# Patient Record
Sex: Male | Born: 1990 | Race: White | Hispanic: No | Marital: Married | Smoking: Never smoker
Health system: Southern US, Community
[De-identification: ages and names within clinical notes are randomized; demographics above are authoritative.]

## PROBLEM LIST (undated history)

## (undated) DIAGNOSIS — G4730 Sleep apnea, unspecified: Secondary | ICD-10-CM

## (undated) HISTORY — DX: Sleep apnea, unspecified: G47.30

---
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
## 2013-04-16 HISTORY — PX: APPENDECTOMY: SHX54

## 2019-01-08 ENCOUNTER — Encounter: Payer: Self-pay | Admitting: Cardiology

## 2019-01-08 ENCOUNTER — Other Ambulatory Visit: Payer: Self-pay

## 2019-01-08 ENCOUNTER — Ambulatory Visit (INDEPENDENT_AMBULATORY_CARE_PROVIDER_SITE_OTHER): Payer: BC Managed Care – PPO | Admitting: Cardiology

## 2019-01-08 VITALS — BP 124/72 | HR 45 | Ht 76.0 in | Wt 279.0 lb

## 2019-01-08 DIAGNOSIS — Z7189 Other specified counseling: Secondary | ICD-10-CM

## 2019-01-08 DIAGNOSIS — Z8249 Family history of ischemic heart disease and other diseases of the circulatory system: Secondary | ICD-10-CM

## 2019-01-08 DIAGNOSIS — R002 Palpitations: Secondary | ICD-10-CM

## 2019-01-08 NOTE — Progress Notes (Signed)
Cardiology Office Note:    Date:  01/08/2019   ID:  Jose Jenkins, DOB 11/14/1990, MRN [id number]  PCP:  Patient, No Pcp Per  Cardiologist:  Jodelle RedBridgette Timothy Trudell, MD PhD  Referring MD: Clent RidgesMercado, Ray Anthony, DO   Chief Complaint  Patient presents with  . New Patient (Initial Visit)    referred by PCP for Wplf-parkinson. Meds reviewed verbally with patient.     History of Present Illness:    Jose Jenkins is a 28 y.o. male with a hx of sleep apnea who is seen as a new consult at the request of Clent RidgesMercado, Ray Anthony, DO for the evaluation and management of CV risk/family history.  Cardiovascular risk factors: Prior clinical ASCVD: none Comorbid conditions,: No hypertension, hyperlipidemia, diabetes, chronic kidney disease:  Metabolic syndrome/Obesity: BMI 34 Chronic inflammatory conditions: none Tobacco use history: never Family history: dad had WPW, had ablation 3 years ago. Pat Gpa died of MI at age 29 (had 3 MI prior, smoker). No other heart disease. Griselda MinerPat Gma had diabetes. Pat uncle died suddenly at age 29, not clear why (he was obese, had gastric bypass surgery). Pat great-gpa died suddenly at age 29. Mom's side without issues. Both brothers have been screened for WPW and were negative Prior cardiac testing and/or incidental findings on other testing (ie coronary calcium): none that he knows of. 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 Exercise level: Very active. Works out 4-5 times/week at [employer] gym (starting back now that gyms opening). Hikes. No limitations. Current diet: very health. Wife runs marathons and cooks very healthy.   Feels perfectly healthy. 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 Used to have a lot more with stress. Notes that it takes him a long time to get his heart rate up when he exercises.  Denies chest pain, shortness of breath at rest or with normal exertion. No PND, orthopnea, LE edema or unexpected weight gain. No  syncope.  We discussed what WPW is, reviewed anatomy on 3D model.  Past Medical History:  Diagnosis Date  . Sleep apnea     Past Surgical History:  Procedure Laterality Date  . ADENOIDECTOMY  2004  . APPENDECTOMY  2015  . NASAL SEPTUM SURGERY  2008  . TONSILLECTOMY  2004    Current Medications: Current Outpatient Medications on File Prior to Visit  Medication Sig  . azelastine (ASTELIN) 0.1 % nasal spray   . levocetirizine (XYZAL) 5 MG tablet   . montelukast (SINGULAIR) 10 MG tablet    No current facility-administered medications on file prior to visit.      Allergies:   Amoxicillin   Social History   Socioeconomic History  . Marital status: Married    Spouse name: Not on file  . Number of children: Not on file  . Years of education: Not on file  . Highest education level: Not on file  Occupational History  . Not on file  Social Needs  . Financial resource strain: Not on file  . Food insecurity    Worry: Not on file    Inability: Not on file  . Transportation needs    Medical: Not on file    Non-medical: Not on file  Tobacco Use  . Smoking status: Never Smoker  . Smokeless tobacco: Never Used  Substance and Sexual Activity  . Alcohol use: Yes  . Drug use: Never  . Sexual activity: Not on file  Lifestyle  . Physical activity  Days per week: Not on file    Minutes per session: Not on file  . Stress: Not on file  Relationships  . Social Herbalist on phone: Not on file    Gets together: Not on file    Attends religious service: Not on file    Active member of club or organization: Not on file    Attends meetings of clubs or organizations: Not on file    Relationship status: Not on file  Other Topics Concern  . Not on file  Social History Narrative  . Not on file     Family History: The patient's family history includes Heart attack in his paternal grandmother.  ROS:   Please see the history of present illness.  Additional pertinent  ROS: Constitutional: Negative for chills, fever, night sweats, unintentional weight loss  HENT: Negative for ear pain and hearing loss.   Eyes: Negative for loss of vision and eye pain.  Respiratory: Negative for cough, sputum, wheezing.   Cardiovascular: See HPI. Gastrointestinal: Negative for abdominal pain, melena, and hematochezia.  Genitourinary: Negative for dysuria and hematuria.  Musculoskeletal: Negative for falls and myalgias.  Skin: Negative for itching and rash.  Neurological: Negative for focal weakness, focal sensory changes and loss of consciousness.  Endo/Heme/Allergies: Does not bruise/bleed easily.     EKGs/Labs/Other Studies Reviewed:    The following studies were reviewed today: No prior cardiac studies  EKG:  EKG is personally reviewed.  The ekg ordered today demonstrates sinus bradycardia, HR 45. Normal PR interval, no obvious delta wave  Recent Labs: No results found for requested labs within last 8760 hours.  Recent Lipid Panel No results found for: CHOL, TRIG, HDL, CHOLHDL, VLDL, LDLCALC, LDLDIRECT  Physical Exam:    VS:  BP 124/72 (BP Location: Right Arm, Patient Position: Sitting, Cuff Size: Normal)   Pulse (!) 45   Ht 6\' 4"  (1.93 m)   Wt 279 lb (126.6 kg)   BMI 33.96 kg/m     Wt Readings from Last 3 Encounters:  01/08/19 279 lb (126.6 kg)    GEN: Well nourished, well developed in no acute distress HEENT: Normal, moist mucous membranes NECK: No JVD CARDIAC: regular rhythm, normal S1 and S2, no murmurs, rubs, gallops.  VASCULAR: Radial and DP pulses 2+ bilaterally. No carotid bruits RESPIRATORY:  Clear to auscultation without rales, wheezing or rhonchi  ABDOMEN: Soft, non-tender, non-distended MUSCULOSKELETAL:  Ambulates independently SKIN: Warm and dry, no edema NEUROLOGIC:  Alert and oriented x 3. No focal neuro deficits noted. PSYCHIATRIC:  Normal affect    ASSESSMENT:    1. Palpitations   2. Family history of Wolff-Parkinson-White  (WPW) syndrome   3. Family history of heart disease   4. Cardiac risk counseling   5. Counseling on health promotion and disease prevention    PLAN:    Palpitations, history of WPW in his father, unclear etiology of sudden death in paternal uncle: he personally has not been diagnosed, no syncope, no exercise limitations. Does have palpitations -2 week Zio when insurance changes, he will contact us -discussed treadmill exercise test, but if he wears Zio during his normal exercise, this will be similar -discussed that if he has syncope or very rapid heart rates he needs to call me--he actually has a very low heart rate and has trouble raising it with exercise  Cardiac risk counseling and prevention recommendations: -recommend heart healthy/Mediterranean diet, with whole grains, fruits, vegetable, fish, lean meats, nuts, and olive oil.  Limit salt. -recommend moderate walking, 3-5 times/week for 30-50 minutes each session. Aim for at least 150 minutes.week. Goal should be pace of 3 miles/hours, or walking 1.5 miles in 30 minutes -recommend avoidance of tobacco products. Avoid excess alcohol. -ASCVD risk score: The ASCVD Risk score Denman George DC Jr., et al., 2013) failed to calculate for the following reasons:   The 2013 ASCVD risk score is only valid for ages 75 to 78    Plan for follow up: TBD based on results of testing  Medication Adjustments/Labs and Tests Ordered: Current medicines are reviewed at length with the patient today.  Concerns regarding medicines are outlined above.  Orders Placed This Encounter  Procedures  . EKG 12-Lead   No orders of the defined types were placed in this encounter.   Patient Instructions  Medication Instructions:  Your Physician recommend you continue on your current medication as directed.    If you need a refill on your cardiac medications before your next appointment, please call your pharmacy.   Lab work: None  Testing/Procedures: None   Follow-Up: At [facility], you and your health needs are our priority.  As part of our continuing mission to provide you with exceptional heart care, we have created designated Provider Care Teams.  These Care Teams include your primary Cardiologist (physician) and Advanced Practice Providers (APPs -  Physician Assistants and Nurse Practitioners) who all work together to provide you with the care you need, when you need it. You will need a follow up appointment as needed.  Please call our office 2 months in advance to schedule this appointment.  You may see Jodelle Red, MD or one of the following Advanced Practice Providers on your designated Care Team:   Theodore Demark, PA-C . Joni Reining, DNP, ANP  Any Other Special Instructions Will Be Listed Below (If Applicable). -Call office when you are ready to order Zio monitor.     Signed, Jodelle Red, MD PhD 01/08/2019 10:15 PM    [facility]

## 2019-01-08 NOTE — Patient Instructions (Signed)
Medication Instructions:  Your Physician recommend you continue on your current medication as directed.    If you need a refill on your cardiac medications before your next appointment, please call your pharmacy.   Lab work: None  Testing/Procedures: None  Follow-Up: At [facility], you and your health needs are our priority.  As part of our continuing mission to provide you with exceptional heart care, we have created designated Provider Care Teams.  These Care Teams include your primary Cardiologist (physician) and Advanced Practice Providers (APPs -  Physician Assistants and Nurse Practitioners) who all work together to provide you with the care you need, when you need it. You will need a follow up appointment as needed.  Please call our office 2 months in advance to schedule this appointment.  You may see Buford Dresser, MD or one of the following Advanced Practice Providers on your designated Care Team:   Rosaria Ferries, PA-C . Jory Sims, DNP, ANP  Any Other Special Instructions Will Be Listed Below (If Applicable). -Call office when you are ready to order Zio monitor.

## 2019-04-04 ENCOUNTER — Encounter (HOSPITAL_COMMUNITY): Payer: Self-pay

## 2019-04-04 ENCOUNTER — Emergency Department (HOSPITAL_COMMUNITY): Payer: BC Managed Care – PPO

## 2019-04-04 ENCOUNTER — Emergency Department (HOSPITAL_COMMUNITY)
Admission: EM | Admit: 2019-04-04 | Discharge: 2019-04-04 | Disposition: A | Discharge status: Home / Self Care | Payer: BC Managed Care – PPO | Attending: Emergency Medicine | Admitting: Emergency Medicine

## 2019-04-04 ENCOUNTER — Other Ambulatory Visit: Payer: Self-pay

## 2019-04-04 DIAGNOSIS — U071 COVID-19: Secondary | ICD-10-CM | POA: Insufficient documentation

## 2019-04-04 DIAGNOSIS — R042 Hemoptysis: Secondary | ICD-10-CM | POA: Diagnosis present

## 2019-04-04 LAB — COMPREHENSIVE METABOLIC PANEL
ALT: 30 U/L (ref 0–44)
AST: 18 U/L (ref 15–41)
Albumin: 4.6 g/dL (ref 3.5–5.0)
Alkaline Phosphatase: 58 U/L (ref 38–126)
Anion gap: 10 (ref 5–15)
BUN: 11 mg/dL (ref 6–20)
CO2: 25 mmol/L (ref 22–32)
Calcium: 9.3 mg/dL (ref 8.9–10.3)
Chloride: 104 mmol/L (ref 98–111)
Creatinine, Ser: 0.97 mg/dL (ref 0.61–1.24)
GFR calc Af Amer: >60 mL/min (ref >60)
GFR calc non Af Amer: >60 mL/min (ref >60)
Glucose, Bld: 99 mg/dL (ref 70–99)
Potassium: 4.2 mmol/L (ref 3.5–5.1)
Sodium: 139 mmol/L (ref 135–145)
Total Bilirubin: 0.5 mg/dL (ref 0.3–1.2)
Total Protein: 8 g/dL (ref 6.5–8.1)

## 2019-04-04 LAB — CBC WITH DIFFERENTIAL/PLATELET
Abs Immature Granulocytes: 0.03 10*3/uL (ref 0.00–0.07)
Basophils Absolute: 0 10*3/uL (ref 0.0–0.1)
Basophils Relative: 1 %
Eosinophils Absolute: 0 10*3/uL (ref 0.0–0.5)
Eosinophils Relative: 0 %
HCT: 48.4 % (ref 39.0–52.0)
Hemoglobin: 15.6 g/dL (ref 13.0–17.0)
Immature Granulocytes: 1 %
Lymphocytes Relative: 31 %
Lymphs Abs: 1.9 10*3/uL (ref 0.7–4.0)
MCH: 28.1 pg (ref 26.0–34.0)
MCHC: 32.2 g/dL (ref 30.0–36.0)
MCV: 87.1 fL (ref 80.0–100.0)
Monocytes Absolute: 0.5 10*3/uL (ref 0.1–1.0)
Monocytes Relative: 8 %
Neutro Abs: 3.6 10*3/uL (ref 1.7–7.7)
Neutrophils Relative %: 59 %
Platelets: 206 10*3/uL (ref 150–400)
RBC: 5.56 MIL/uL (ref 4.22–5.81)
RDW: 12.6 % (ref 11.5–15.5)
WBC: 6.1 10*3/uL (ref 4.0–10.5)
nRBC: 0 % (ref 0.0–0.2)

## 2019-04-04 LAB — PROCALCITONIN: Procalcitonin: <0.1 ng/mL

## 2019-04-04 LAB — FERRITIN: Ferritin: 135 ng/mL (ref 24–336)

## 2019-04-04 LAB — C-REACTIVE PROTEIN: CRP: 0.8 mg/dL (ref <1.0)

## 2019-04-04 LAB — D-DIMER, QUANTITATIVE: D-Dimer, Quant: 0.27 ug/mL-FEU (ref 0.00–0.50)

## 2019-04-04 LAB — LACTATE DEHYDROGENASE: LDH: 137 U/L (ref 98–192)

## 2019-04-04 LAB — TRIGLYCERIDES: Triglycerides: 70 mg/dL (ref <150)

## 2019-04-04 LAB — FIBRINOGEN: Fibrinogen: 332 mg/dL (ref 210–475)

## 2019-04-04 LAB — LACTIC ACID, PLASMA
Lactic Acid, Venous: 1.1 mmol/L (ref 0.5–1.9)
Lactic Acid, Venous: 0.9 mmol/L (ref 0.5–1.9)

## 2019-04-04 MED ORDER — AZITHROMYCIN 250 MG PO TABS: 500.0000 mg | ORAL_TABLET | Freq: Once | ORAL | Status: DC

## 2019-04-04 NOTE — ED Provider Notes (Signed)
[facility]-EMERGENCY DEPT Provider Note   CSN: [id number] Arrival date & time: 04/04/19  1815     History Chief Complaint  Patient presents with  . COVID+  . Hemoptysis    Jose Jenkins is a 28 y.o. male.  HPI Patient developed coronavirus symptoms 7 days ago.  Symptoms started with cough fever and body aches.  Patient reports that he did take a Z-Pak and a course of prednisone on outpatient basis.  Several days into his illness he was tested for coronavirus and tested Covid positive.  He reports today he started to have cough that was productive of a blood-tinged sputum.  He reports there were a few dark streaks of blood in the mucus.  No clots.  He denies any focal or sharp chest pain.  He reports he has a little bit of dull congestion kind of sensation.  Patient reports he actually feels better today than he has over the past week.  He felt that he was improving but then when the blood-tinged mucus occurred, his doctor recommended he come to the emergency department and be evaluated for remdesivir therapy.    Past Medical History:  Diagnosis Date  . Sleep apnea     Patient Active Problem List   Diagnosis Date Noted  . Palpitations 01/08/2019  . Family history of Wolff-Parkinson-White (WPW) syndrome 01/08/2019    Past Surgical History:  Procedure Laterality Date  . ADENOIDECTOMY  2004  . APPENDECTOMY  2015  . NASAL SEPTUM SURGERY  2008  . TONSILLECTOMY  2004       Family History  Problem Relation Age of Onset  . Heart attack Paternal Grandmother     Social History   Tobacco Use  . Smoking status: Never Smoker  . Smokeless tobacco: Never Used  Substance Use Topics  . Alcohol use: Yes  . Drug use: Never    Home Medications Prior to Admission medications   Medication Sig Start Date End Date Taking? Authorizing Provider  azelastine (ASTELIN) 0.1 % nasal spray  01/07/19   [provider]  levocetirizine (XYZAL) 5 MG tablet   01/07/19   [provider]  montelukast (SINGULAIR) 10 MG tablet  01/07/19   [provider]    Allergies    Amoxicillin  Review of Systems   Review of Systems 10 Systems reviewed and are negative for acute change except as noted in the HPI. Physical Exam Updated Vital Signs BP (!) 151/88   Pulse (!) 59   Temp 98.6 F (37 C)   Resp (!) 22   Ht 6\' 4"  (1.93 m)   Wt 127 kg   SpO2 99%   BMI 34.08 kg/m   Physical Exam Constitutional:      Comments: Alert nontoxic and clinically well in appearance.  HENT:     Head: Normocephalic and atraumatic.     Mouth/Throat:     Mouth: Mucous membranes are moist.     Pharynx: Oropharynx is clear.     Comments: Airway widely patent and clear.  No exudate or erythema.  No posterior nasal drip or bloody drainage. Eyes:     Extraocular Movements: Extraocular movements intact.  Cardiovascular:     Rate and Rhythm: Normal rate and regular rhythm.     Heart sounds: Normal heart sounds.  Pulmonary:     Comments: No respiratory distress.  Occasional cough.  Lung sounds grossly clear.  Occasional rhonchi clears with cough.  No gross wheezing or rale. Abdominal:  General: There is no distension.     Palpations: Abdomen is soft.     Tenderness: There is no abdominal tenderness. There is no guarding.  Musculoskeletal:        General: No swelling or tenderness. Normal range of motion.     Cervical back: Neck supple.  Skin:    General: Skin is warm and dry.  Neurological:     General: No focal deficit present.     Mental Status: He is oriented to person, place, and time.     Cranial Nerves: No cranial nerve deficit.     Coordination: Coordination normal.  Psychiatric:        Mood and Affect: Mood normal.     ED Results / Procedures / Treatments   Labs (all labs ordered are listed, but only abnormal results are displayed) Labs Reviewed - No data to display  EKG None  Radiology No results  found.  Procedures Procedures (including critical care time)  Medications Ordered in ED Medications - No data to display  ED Course  I have reviewed the triage vital signs and the nursing notes.  Pertinent labs & imaging results that were available during my care of the patient were reviewed by me and considered in my medical decision making (see chart for details).    MDM Rules/Calculators/A&P                      Consult: Reviewed with Dr. Maudie Mercury.  At this time patient does not have criteria for hospital admission.  He has no hypoxia and markers are normal.  He does advise that [facility] has started outpatient remdesivir infusions based on physician order.  Patient presents with positive history of coronavirus.  He does report he has been improving but then today had blood-tinged mucus with cough.  Patient's inflammatory markers are normal.  His D-dimer is normal.  Oxygen saturation is 99 to 100% while I am in the room observing the patient.  Patient does not have significant comorbid conditions.  He was treated with a course of Zithromax and prednisone which he just finished a day or so ago.  At this time, patient does not meet criteria for inpatient treatment.  I have reviewed with him return precautions.  Have also reviewed the possibility of having his primary doctor determine if he may be a candidate for outpatient remdesivir infusion therapy.  He has been given contact information for the infectious disease clinic and instructions to review this with his PCP.  Return precautions reviewed. Final Clinical Impression(s) / ED Diagnoses Final diagnoses:  COVID-19  Hemoptysis    Rx / DC Orders ED Discharge Orders    None       Charlesetta Shanks, MD 04/04/19 2233

## 2019-04-04 NOTE — Discharge Instructions (Addendum)
1.  Have your doctor continue to monitor your oxygen level and blood counts on outpatient basis.  Today, all of your inflammatory markers for Covid were normal.  Your oxygen level was at 99 to 100%. [facility] is doing outpatient remdesivir treatments for qualifying candidates.  Have your doctor determine if you could qualify for remdesivir and arrange outpatient infusion therapy.  At this time, you do not meet criteria for inpatient treatment. 3.  Return to the hospital immediately if you find you are getting increasingly short of breath, fevers and weakness, confusion or other concerning symptoms.

## 2019-04-04 NOTE — ED Triage Notes (Signed)
Pt reports he was tested for COVID on Monday and received positive result yesterday. Pt reports he started coughing up blood today. Pt denies clots and describes sputum that is blood tinged. Pt al;so reports mild SHOB and chest pain.

## 2019-04-04 NOTE — ED Notes (Signed)
X-ray at bedside

## 2019-04-05 LAB — SARS CORONAVIRUS 2 (TAT 6-24 HRS): SARS Coronavirus 2: POSITIVE — AB

## 2019-04-10 LAB — CULTURE, BLOOD (ROUTINE X 2)
Culture: NO GROWTH
Culture: NO GROWTH
Special Requests: ADEQUATE
Special Requests: ADEQUATE

## 2019-07-02 ENCOUNTER — Ambulatory Visit: Payer: BC Managed Care – PPO | Attending: Internal Medicine

## 2019-07-02 DIAGNOSIS — Z23 Encounter for immunization: Secondary | ICD-10-CM

## 2019-07-02 NOTE — Progress Notes (Signed)
   Covid-19 Vaccination Clinic  Name:  Jose Jenkins    MRN: [id number] DOB: 27-Aug-1990  07/02/2019  [name] was observed post Covid-19 immunization for 15 minutes without incident. He was provided with Vaccine Information Sheet and instruction to access the V-Safe system.   [name] was instructed to call 911 with any severe reactions post vaccine: Marland Kitchen Difficulty breathing  . Swelling of face and throat  . A fast heartbeat  . A bad rash all over body  . Dizziness and weakness   Immunizations Administered    Name Date Dose VIS Date Route   Pfizer COVID-19 Vaccine 07/02/2019  4:20 PM 0.3 mL 03/27/2019 Intramuscular   Manufacturer: [employer], [employer]   Lot: [id number]   NDC: [address]-8

## 2019-07-27 ENCOUNTER — Ambulatory Visit: Payer: BC Managed Care – PPO | Attending: Internal Medicine

## 2019-07-27 DIAGNOSIS — Z23 Encounter for immunization: Secondary | ICD-10-CM

## 2019-07-27 NOTE — Progress Notes (Signed)
   Covid-19 Vaccination Clinic  Name:  Jose Jenkins    MRN: [id number] DOB: 1990/12/30  07/27/2019  Mr. Jose Jenkins was observed post Covid-19 immunization for 15 minutes without incident. He was provided with Vaccine Information Sheet and instruction to access the V-Safe system.   Mr. Jose Jenkins was instructed to call 911 with any severe reactions post vaccine: Marland Kitchen Difficulty breathing  . Swelling of face and throat  . A fast heartbeat  . A bad rash all over body  . Dizziness and weakness   Immunizations Administered    Name Date Dose VIS Date Route   Pfizer COVID-19 Vaccine 07/27/2019  4:17 PM 0.3 mL 03/27/2019 Intramuscular   Manufacturer: [employer], [employer]   Lot: [id number]   NDC: [address]-2

## 2019-10-15 ENCOUNTER — Other Ambulatory Visit: Payer: Self-pay | Admitting: Otolaryngology

## 2019-10-15 DIAGNOSIS — J329 Chronic sinusitis, unspecified: Secondary | ICD-10-CM

## 2019-10-26 ENCOUNTER — Ambulatory Visit
Admission: RE | Admit: 2019-10-26 | Discharge: 2019-10-26 | Disposition: A | Discharge status: Home / Self Care | Payer: BC Managed Care – PPO | Source: Ambulatory Visit | Attending: Otolaryngology | Admitting: Otolaryngology

## 2019-10-26 DIAGNOSIS — J329 Chronic sinusitis, unspecified: Secondary | ICD-10-CM

## 2020-11-11 IMAGING — CT CT MAXILLOFACIAL W/O CM
3 of 5 series · 15 of 47 positions shown, 18 images · non-contrast
Comparison: None.

CLINICAL DATA: Chronic sinusitis.  Sinus pain and pressure headache

EXAM:
CT MAXILLOFACIAL WITHOUT CONTRAST
TECHNIQUE: Multidetector CT images of the paranasal sinuses were obtained using
the standard protocol without intravenous contrast.

[Series 4: sinus 2.00 hr60 s3 cor · coronal · 0.25mm/px · 3 of 83 slices shown]
[im 28/83  bone]
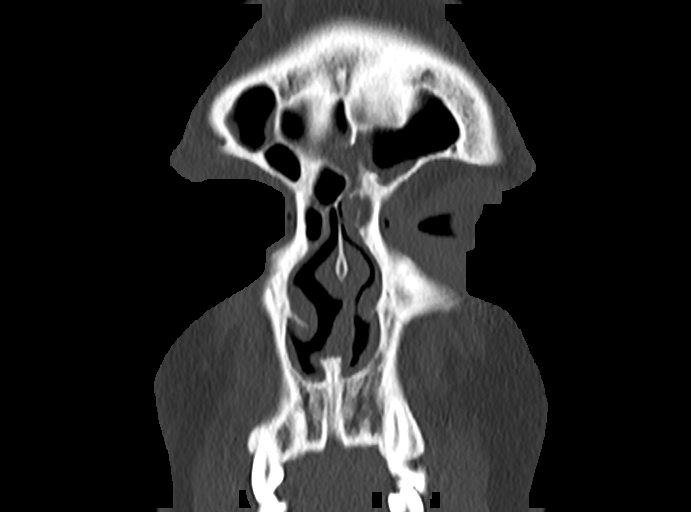
[im 37/83  bone]
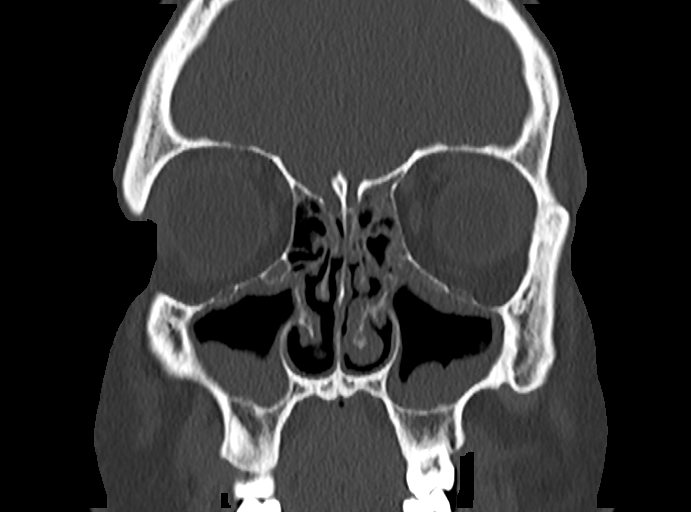
[im 46/83  bone]
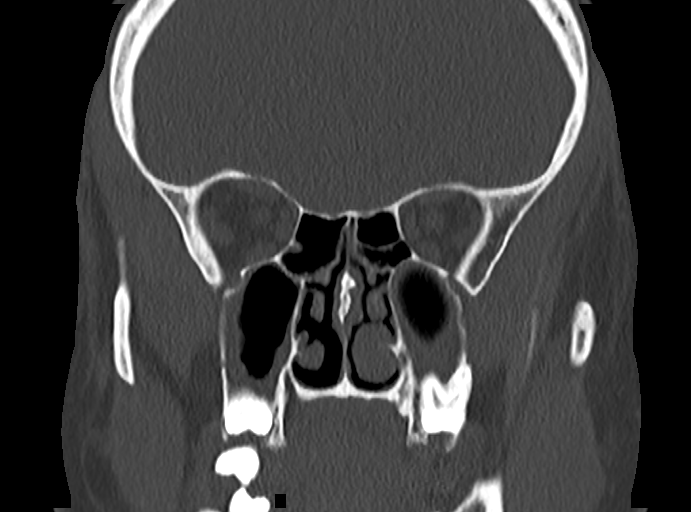

[Series 6: sinus 2.00 hr60 s3 sag · sagittal · 0.25mm/px · 3 of 86 slices shown]
[im 29/86  bone]
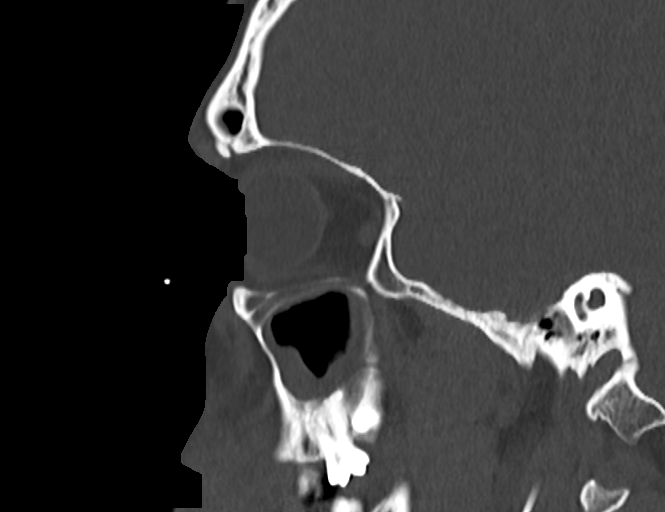
[im 43/86  bone]
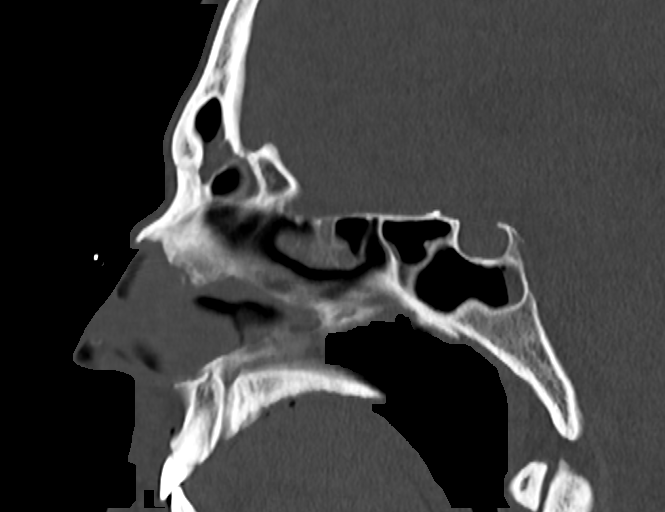
[im 57/86  bone]
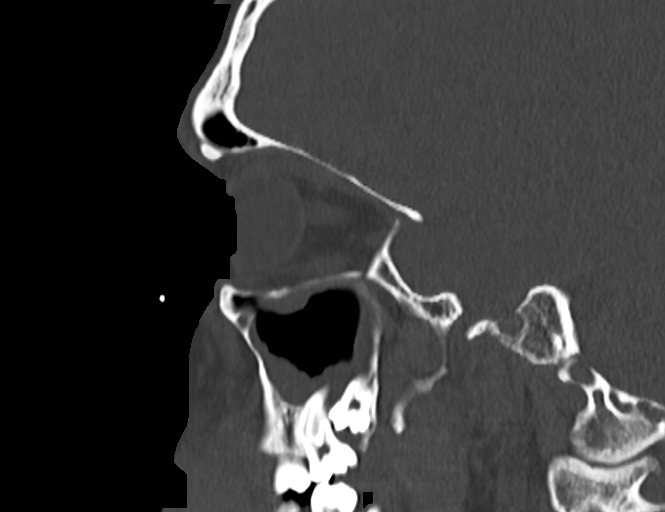

[Series 12: sinus 1.00 hr60 s3 axial fusion thins · axial · 0.33mm/px · z∈[-646,-533]mm · 9 of 213 slices shown, 12 images]
[im 12/213  brain]
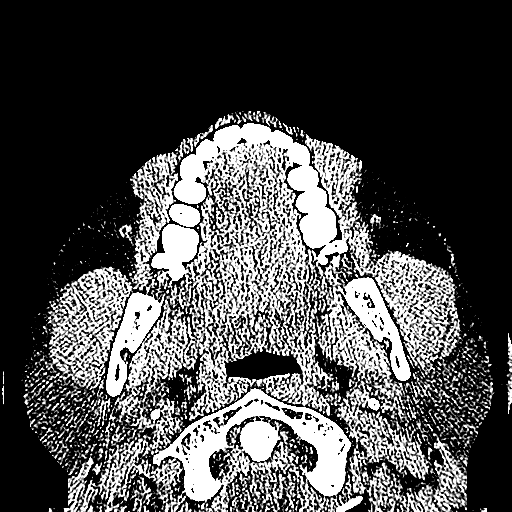
[im 12/213  bone]
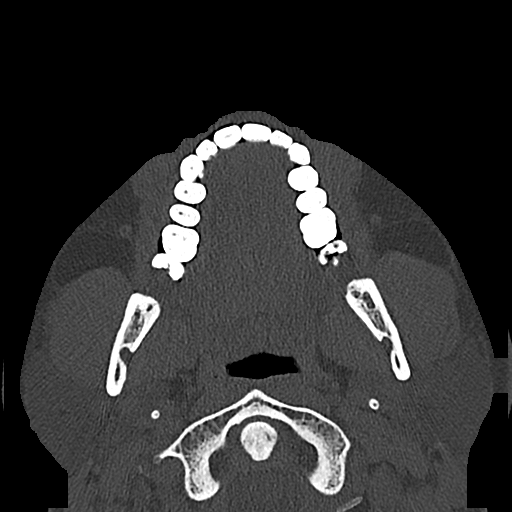
[im 36/213  bone]
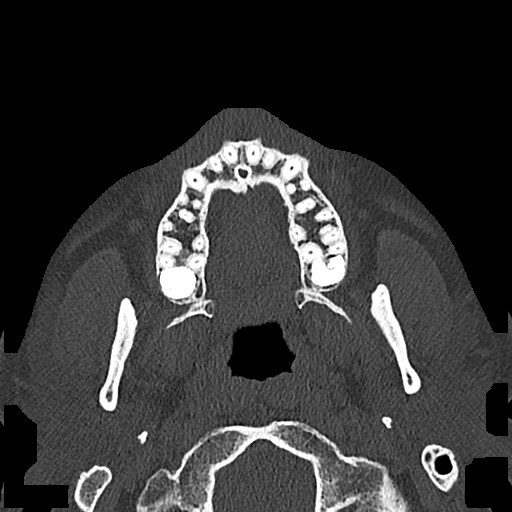
[im 59/213  bone]
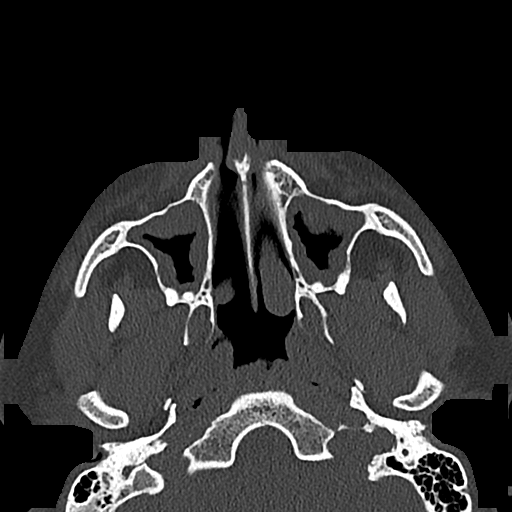
[im 83/213  bone]
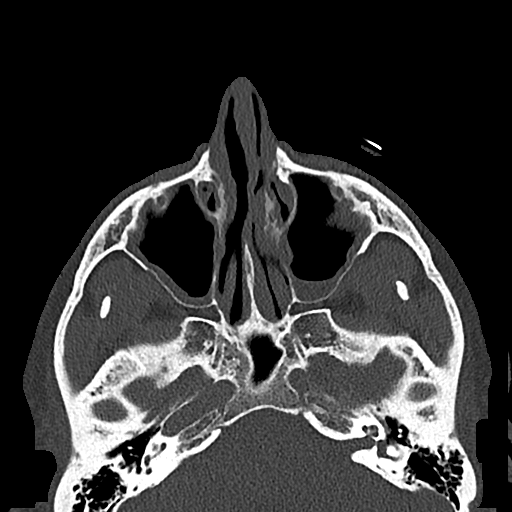
[im 107/213  brain]
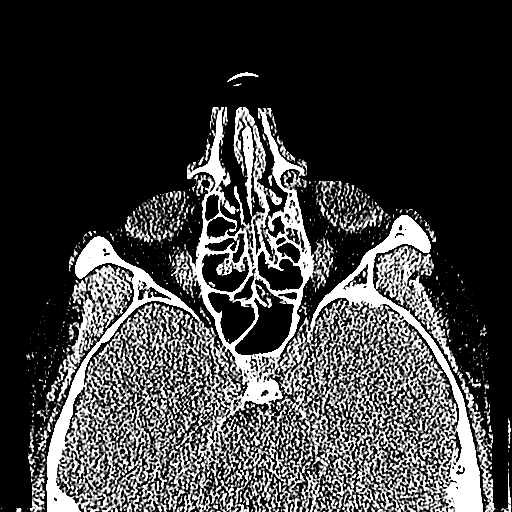
[im 107/213  bone]
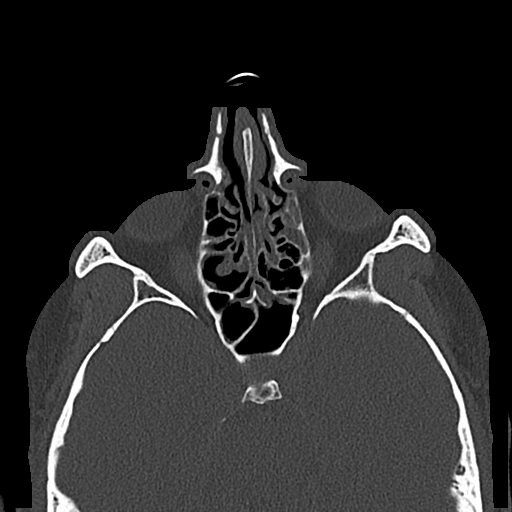
[im 130/213  bone]
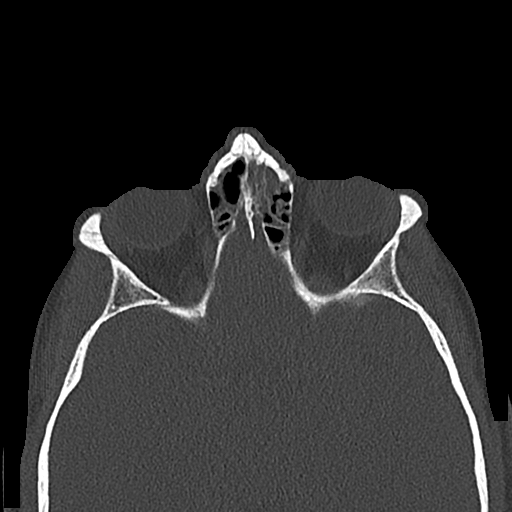
[im 154/213  bone]
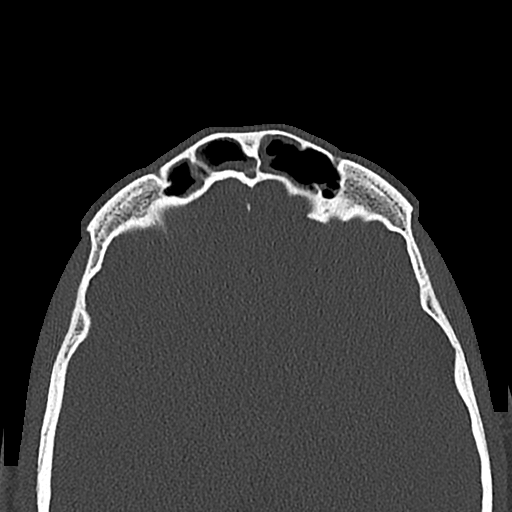
[im 177/213  bone]
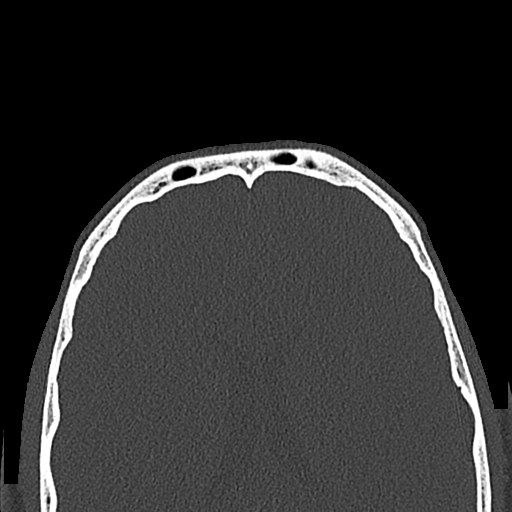
[im 201/213  brain]
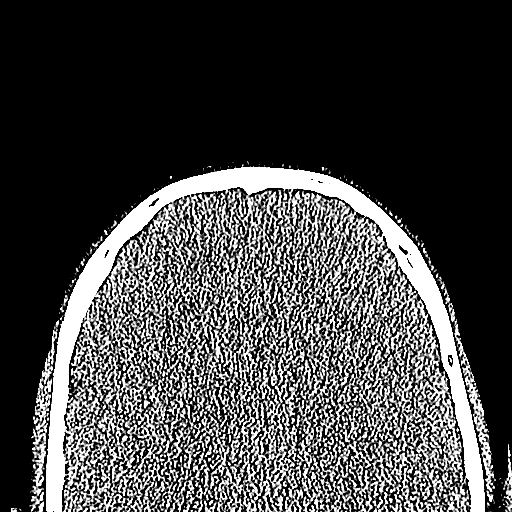
[im 201/213  bone]
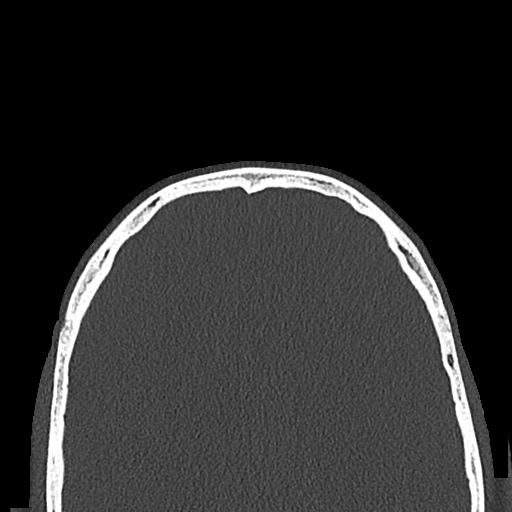

[15 of 47 positions shown; findings below may reference images not displayed]

FINDINGS: Paranasal sinuses:

Frontal: Mild to moderate mucosal edema bilaterally.

Ethmoid: Moderate mucosal edema bilaterally.

Maxillary: Moderate mucosal edema bilaterally. Air-fluid level left
maxillary sinus.

Sphenoid: Mild mucosal edema with small air-fluid level.

Mastoid and middle ear: Clear bilaterally.

Right ostiomeatal unit: Narrowed due to mucosal edema

Left ostiomeatal unit: Occluded due to mucosal edema.

Nasal passages: Patent. Mild septal deviation to the right

Anatomy: No acute skeletal abnormality

Limited intracranial imaging negative

Negative orbit bilaterally.
IMPRESSION: Pansinusitis with air-fluid level left maxillary sinus and sphenoid
sinus. Occlusion of the left ostiomeatal complex.
